# Patient Record
Sex: Female | Born: 1995 | Hispanic: Yes | Marital: Single | Smoking: Never smoker
Health system: Southern US, Community
[De-identification: ages and names within clinical notes are randomized; demographics above are authoritative.]

## PROBLEM LIST (undated history)

## (undated) DIAGNOSIS — F329 Major depressive disorder, single episode, unspecified: Secondary | ICD-10-CM

## (undated) DIAGNOSIS — F32A Depression, unspecified: Secondary | ICD-10-CM

## (undated) DIAGNOSIS — K589 Irritable bowel syndrome without diarrhea: Secondary | ICD-10-CM

## (undated) HISTORY — PX: TONSILLECTOMY: SUR1361

## (undated) HISTORY — PX: WISDOM TOOTH EXTRACTION: SHX21

---

## 2015-11-19 ENCOUNTER — Ambulatory Visit (HOSPITAL_COMMUNITY)
Admission: RE | Admit: 2015-11-19 | Discharge: 2015-11-19 | Disposition: A | Discharge status: Home / Self Care | Payer: Medicaid Other | Source: Ambulatory Visit | Attending: Obstetrics and Gynecology | Admitting: Obstetrics and Gynecology

## 2015-11-19 ENCOUNTER — Encounter (HOSPITAL_COMMUNITY): Payer: Self-pay

## 2015-11-19 ENCOUNTER — Other Ambulatory Visit (HOSPITAL_COMMUNITY): Payer: Self-pay | Admitting: Obstetrics and Gynecology

## 2015-11-19 DIAGNOSIS — Z3689 Encounter for other specified antenatal screening: Secondary | ICD-10-CM

## 2015-11-19 DIAGNOSIS — O280 Abnormal hematological finding on antenatal screening of mother: Secondary | ICD-10-CM

## 2015-11-19 DIAGNOSIS — Z3A21 21 weeks gestation of pregnancy: Secondary | ICD-10-CM

## 2015-11-19 DIAGNOSIS — Z315 Encounter for genetic counseling: Secondary | ICD-10-CM | POA: Insufficient documentation

## 2015-11-19 DIAGNOSIS — O283 Abnormal ultrasonic finding on antenatal screening of mother: Secondary | ICD-10-CM | POA: Diagnosis not present

## 2015-11-19 HISTORY — DX: Major depressive disorder, single episode, unspecified: F32.9

## 2015-11-19 HISTORY — DX: Irritable bowel syndrome without diarrhea: K58.9

## 2015-11-19 HISTORY — DX: Depression, unspecified: F32.A

## 2015-11-19 NOTE — Progress Notes (Signed)
Genetic Counseling  High-Risk Gestation Note  Appointment Date:  11/19/2015 Referred By: Lennox Solders, DO Date of Birth:  Mar 18, 1996   Pregnancy History: G1P0 Estimated Date of Delivery: 03/26/16 Estimated Gestational Age: [redacted]w[redacted]d Attending: Alpha Gula, MD    Audrey Rocha was seen for genetic counseling because of an increased risk for fetal trisomy 18 based on Quad screen through [facility].  In summary:  Reviewed results of screening test  Increased risk (1 in 10) for Trisomy 18   Discussed additional screening options  NIPS-elected to pursue Panorama today  Ultrasound- performed today, within normal limits  Discussed diagnostic testing options  Amniocentesis-declined  Reviewed family history concerns  Discussed general population carrier screening options  CF- elected to pursue today  SMA-declined  Hemoglobinopathies-declined  She was counseled regarding the screening result and the associated 1 in 10 risk for fetal trisomy 18.  We reviewed chromosomes, nondisjunction, and the common features and poor prognosis of trisomy 34.  In addition, we reviewed the screen adjusted reduction in risks for Down syndrome and open neural tube defects.  We also discussed other explanations for a screen positive result including: a gestational dating error, differences in maternal metabolism, and normal variation.  We reviewed other available screening options including noninvasive prenatal screening (NIPS)/cell free DNA (cfDNA) screening, and detailed ultrasound.  She was counseled that screening tests are used to modify a patient's a priori risk for aneuploidy, typically based on age. This estimate provides a pregnancy specific risk assessment. We reviewed the benefits and limitations of each option. Specifically, we discussed the conditions for which each test screens, the detection rates, and false positive rates of each. She was also counseled  regarding diagnostic testing via amniocentesis. We reviewed the approximate 1 in 300-500 risk for complications from amniocentesis, including spontaneous pregnancy loss. We discussed the possible results that the tests might provide including: positive, negative, unanticipated, and no result. Finally, they were counseled regarding the cost of each option and potential out of pocket expenses.   Detailed ultrasound was performed today and was within normal range. No markers of fetal aneuploidy were appreciated today. Complete ultrasound results reported separately. After consideration of all the options, she elected to proceed with NIPS (Panorama through [facility]).  Those results will be available in 8-10 days.  She declined amniocentesis. She understands that screening tests cannot rule out all birth defects or genetic syndromes. The patient was advised of this limitation and states she still does not want additional testing at this time.   Audrey Rocha was provided with written information regarding cystic fibrosis (CF), spinal muscular atrophy (SMA) and hemoglobinopathies including the carrier frequency, availability of carrier screening and prenatal diagnosis if indicated.  In addition, we discussed that CF and hemoglobinopathies are routinely screened for as part of the [place] newborn screening panel.  After further discussion, she elected to pursue screening for CF and declined screening for SMA and hemoglobinopathies.   Both family histories were reviewed and found to be noncontributory for birth defects, intellectual disability, and known genetic conditions. Consanguinity was denied. Without further information regarding the provided family history, an accurate genetic risk cannot be calculated. Further genetic counseling is warranted if more information is obtained.  Audrey Rocha denied exposure to environmental toxins or chemical agents. She denied the use of alcohol, tobacco or street  drugs. She denied significant viral illnesses during the course of her pregnancy. Her medical and surgical histories were noncontributory.   I counseled  Audrey Rocha for approximately 45 minutes regarding the above risks and available options.   Quinn Plowman, MS,  Certified Genetic Counselor 11/19/2015

## 2015-11-20 ENCOUNTER — Encounter (HOSPITAL_COMMUNITY): Payer: Self-pay

## 2015-11-20 ENCOUNTER — Other Ambulatory Visit (HOSPITAL_COMMUNITY): Payer: Self-pay

## 2015-11-26 ENCOUNTER — Telehealth (HOSPITAL_COMMUNITY): Payer: Self-pay | Admitting: MS"

## 2015-11-26 NOTE — Telephone Encounter (Signed)
Left message for patient to return call.   Clydie BraunKaren Hawraa Stambaugh 11/26/2015 4:30 PM

## 2015-11-30 ENCOUNTER — Other Ambulatory Visit (HOSPITAL_COMMUNITY): Payer: Self-pay

## 2015-12-01 NOTE — Telephone Encounter (Signed)
Called Audrey Rocha to discuss her prenatal cell free DNA test results. Spoke with the patient's mother.  [name] had Panorama testing through [facility].  Testing was offered because of abnormal maternal serum screen.   The patient was identified by name and DOB.  We reviewed that these are within normal limits, showing a less than 1 in 10,000 risk for trisomies 21, 18 and 13, and monosomy X (Turner syndrome).  In addition, the risk for triploidy/vanishing twin and sex chromosome trisomies (47,XXX and 47,XXY) was also low risk. We reviewed that this testing identifies > 99% of pregnancies with trisomy 4221, trisomy 1113, sex chromosome trisomies (47,XXX and 47,XXY), and triploidy. The detection rate for trisomy 18 is 96%.  The detection rate for monosomy X is ~92%.  The false positive rate is <0.1% for all conditions. Testing was also consistent with female fetal sex.  She understands that this testing does not identify all genetic conditions.  All questions were answered to her satisfaction, she was encouraged to call with additional questions or concerns.  Quinn PlowmanKaren Azaan Leask, MS Certified Genetic Counselor 12/01/2015 4:55 PM

## 2015-12-07 ENCOUNTER — Other Ambulatory Visit (HOSPITAL_COMMUNITY): Payer: Self-pay

## 2016-09-23 ENCOUNTER — Encounter (HOSPITAL_COMMUNITY): Payer: Self-pay

## 2016-10-23 IMAGING — US US MFM OB DETAIL+14 WK
1 series · 14 of 28 positions shown · non-contrast
Comparison: none

[Series 1: us mfm ob detail+14 wk · 14 of 70 slices shown]
[im 3/70]
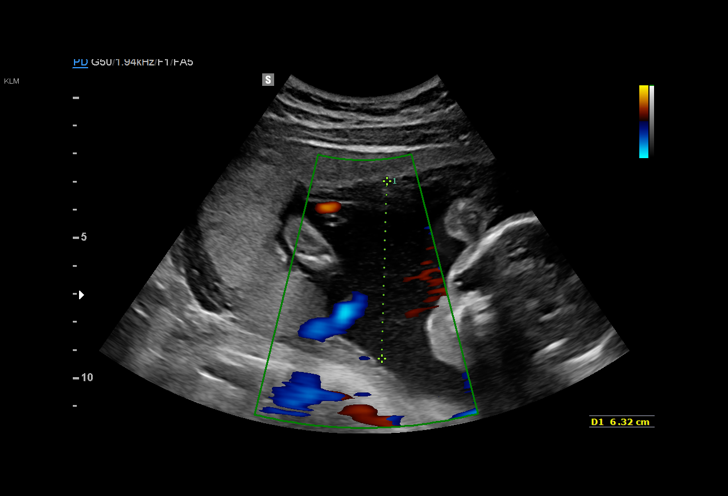
[im 8/70]
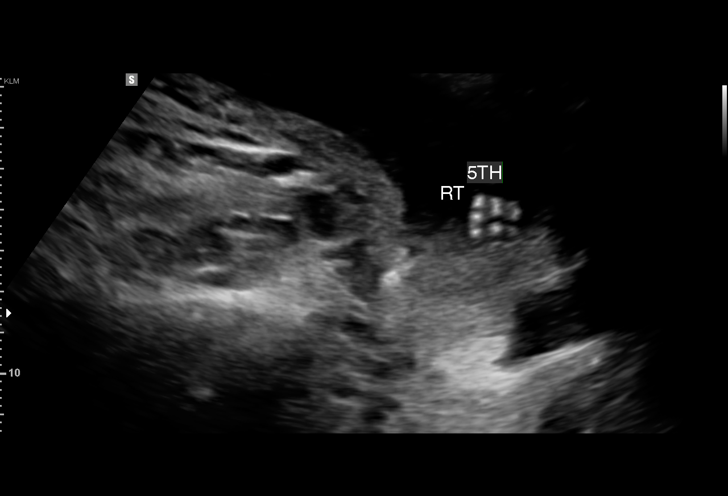
[im 13/70]
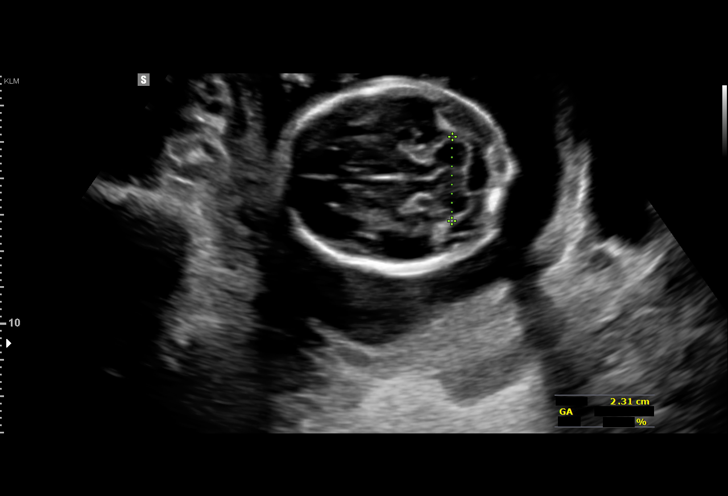
[im 18/70]
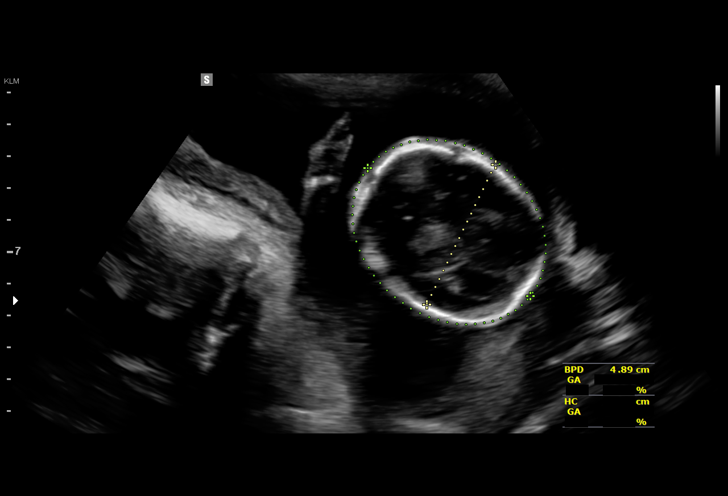
[im 24/70]
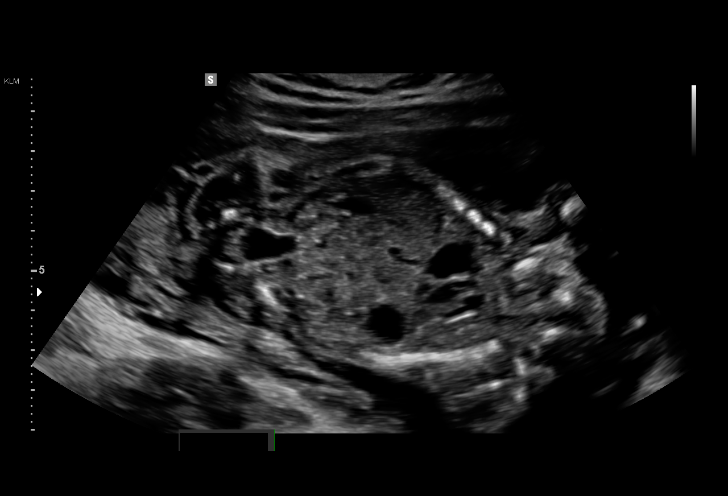
[im 29/70]
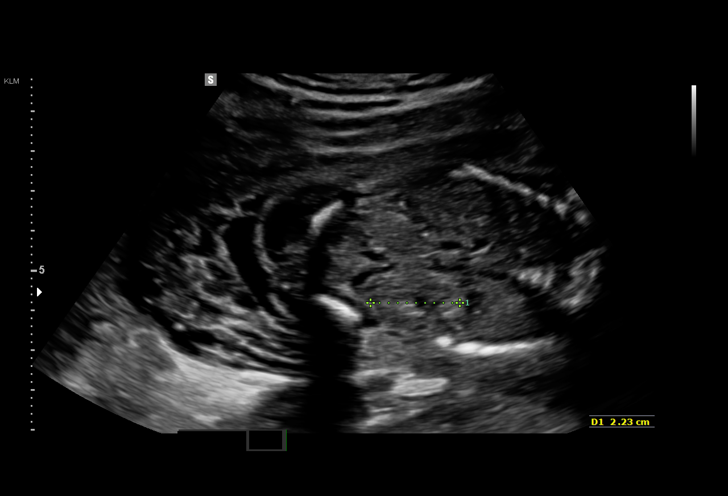
[im 34/70]
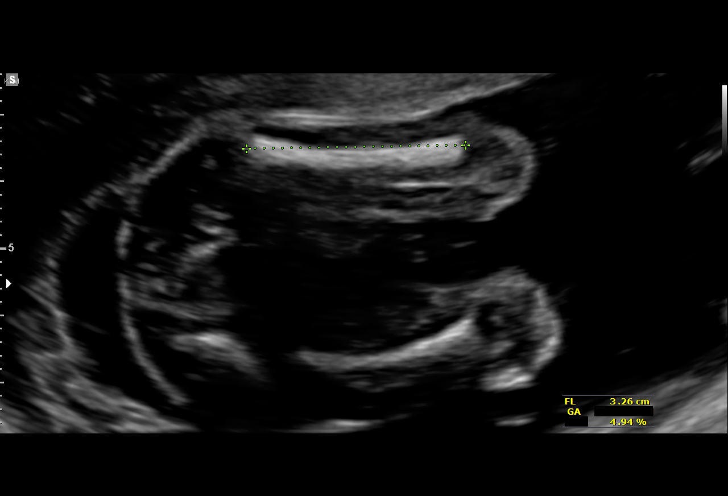
[im 39/70]
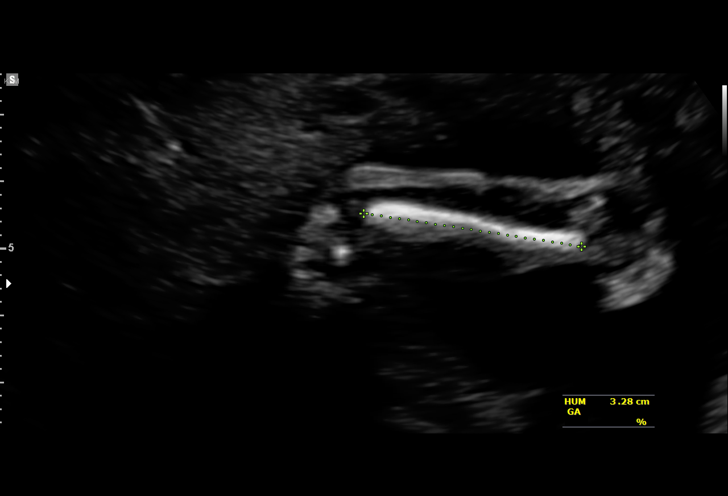
[im 44/70]
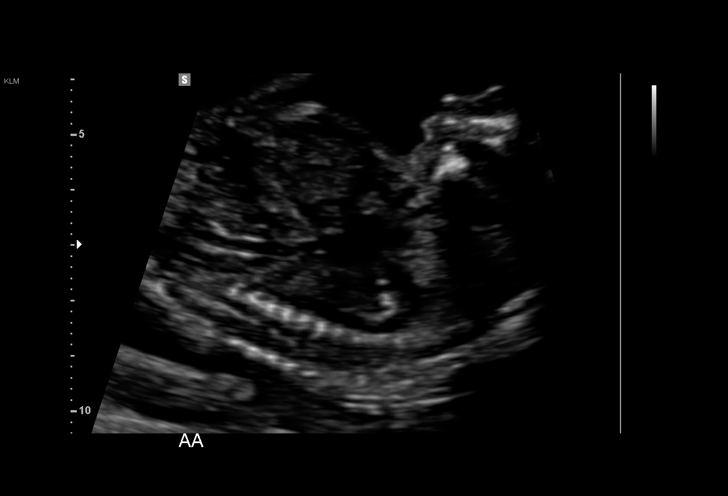
[im 49/70]
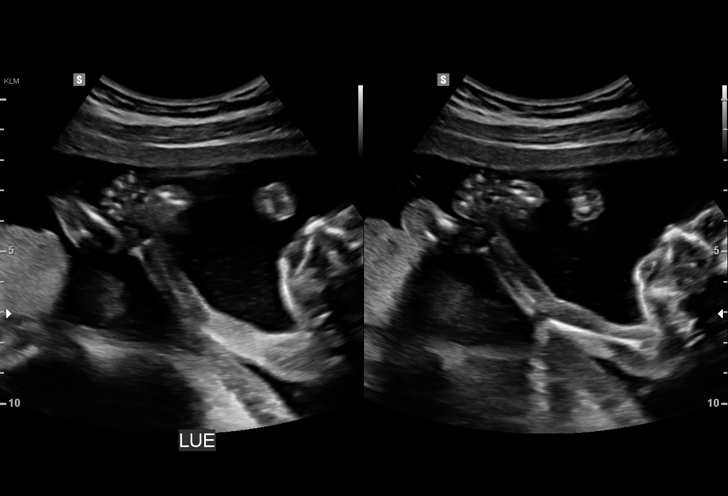
[im 54/70]
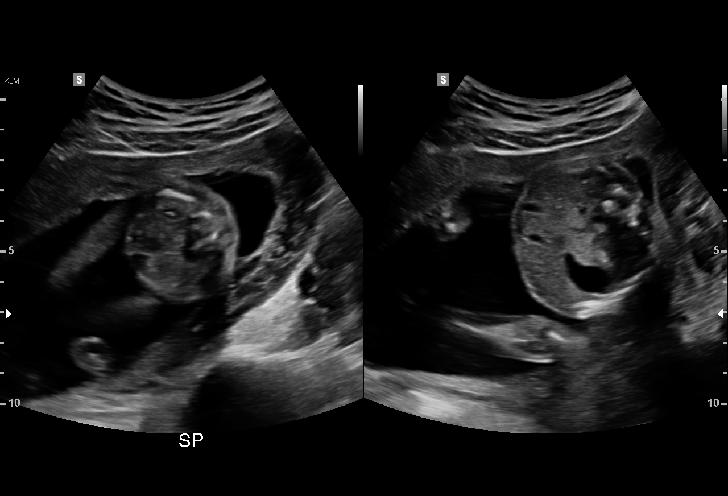
[im 59/70]
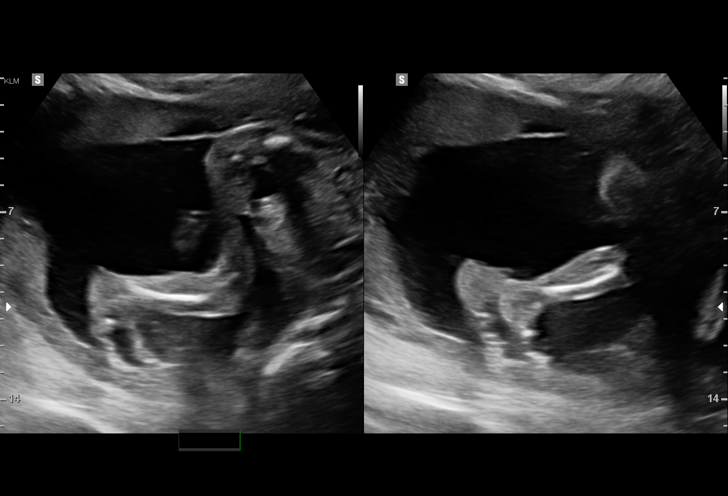
[im 64/70]
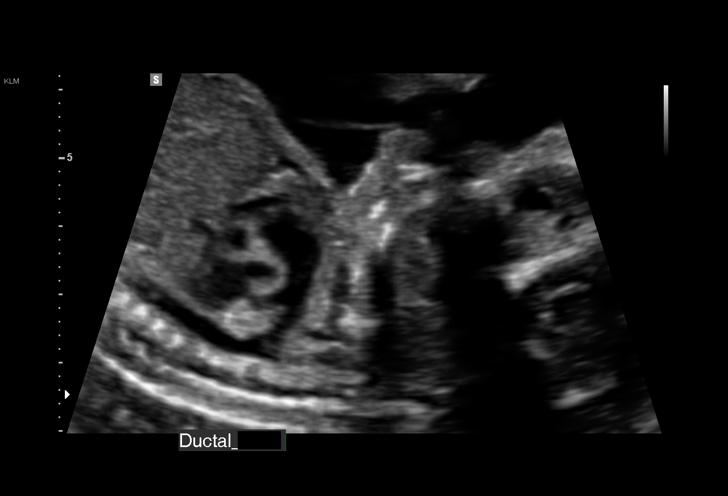
[im 70/70]
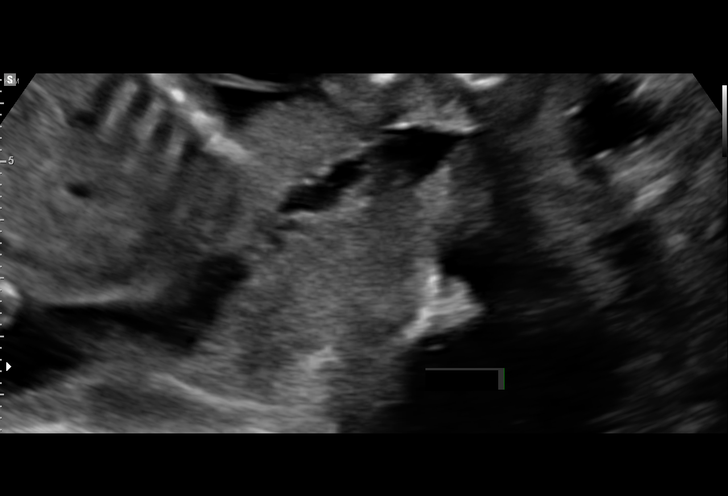

[14 of 28 positions shown; findings below may reference images not displayed]

[REDACTED],
[address]
[HOSPITAL], [place]
JACQUES OLIVIER DO

1  AARTI CLUTTER         [id number]      [id number]     [id number]
Indications

21 weeks gestation of pregnancy
Abnormal biochemical screen (quad) for
Trisomy 18 ([DATE])
Late prenatal care, second trimester
Detailed fetal anatomic survey                 Z36
OB History

Gravidity:    1         Term:   0        Prem:   0        SAB:   0
TOP:          0       Ectopic:  0        Living: 0
Fetal Evaluation

Num Of Fetuses:     1
Fetal Heart         149
Rate(bpm):
Cardiac Activity:   Observed
Presentation:       Cephalic
Placenta:           Fundal, above cervical os
P. Cord Insertion:  Visualized

Amniotic Fluid
AFI FV:      Subjectively within normal limits

Largest Pocket(cm)
6.3
Biometry

BPD:      49.2  mm     G. Age:  20w 6d         18  %    CI:        70.57   %   70 - 86
FL/HC:      17.8   %   18.4 -
HC:      186.7  mm     G. Age:  21w 0d         14  %    HC/AC:      1.09       1.06 -
AC:       171   mm     G. Age:  22w 1d         54  %    FL/BPD:     67.7   %   71 - 87
FL:       33.3  mm     G. Age:  20w 3d          8  %    FL/AC:      19.5   %   20 - 24
HUM:      33.4  mm     G. Age:  21w 2d         38  %
CER:      23.1  mm     G. Age:  21w 4d         45  %

Est. FW:     412  gm    0 lb 15 oz      34  %
Gestational Age

LMP:           21w 5d       Date:   06/20/15                 EDD:   03/26/16
U/S Today:     21w 1d                                        EDD:   03/30/16
Best:          21w 5d    Det. By:   LMP  (06/20/15)          EDD:   03/26/16
Anatomy

Cranium:               Appears normal         Aortic Arch:            Appears normal
Cavum:                 Appears normal         Ductal Arch:            Appears normal
Ventricles:            Appears normal         Diaphragm:              Appears normal
Choroid Plexus:        Appears normal         Stomach:                Appears normal, left
sided
Cerebellum:            Appears normal         Abdomen:                Appears normal
Posterior Fossa:       Appears normal         Abdominal Wall:         Appears nml (cord
insert, abd wall)
Nuchal Fold:           Not applicable (>20    Cord Vessels:           Appears normal (3
wks GA)                                        vessel cord)
Face:                  Appears normal         Kidneys:                Appear normal
(orbits and profile)
Lips:                  Appears normal         Bladder:                Appears normal
Thoracic:              Appears normal         Spine:                  Appears normal
Heart:                 Appears normal         Upper Extremities:      Appears normal
(4CH, axis, and situs
RVOT:                  Appears normal         Lower Extremities:      Appears normal
LVOT:                  Appears normal

Other:  Fetus appears to be a male. Heels and 5th digit visualized. Open
hands visualized. Nasal bone visualized.
Cervix Uterus Adnexa

Cervix
Length:            3.6  cm.
Normal appearance by transabdominal scan.
Impression

Single IUP at 21w 5d
Increased Trisomy 18 risk by Quad screen
Normal detailed fetal anatomy
Open hands visualized
Ultrasound measurements are consistent with LMP
Fundal placenta without previa
Normal amniotic fluid volume
Recommendations

See separate note from Genetics counselor.
Follow-up ultrasounds as clinically indicated.
